# Patient Record
Sex: Male | Born: 1950 | Race: Black or African American | Hispanic: No | Smoking: Former smoker
Health system: Southern US, Community
[De-identification: ages and names within clinical notes are randomized; demographics above are authoritative.]

## PROBLEM LIST (undated history)

## (undated) DIAGNOSIS — G4730 Sleep apnea, unspecified: Secondary | ICD-10-CM

## (undated) DIAGNOSIS — I4891 Unspecified atrial fibrillation: Secondary | ICD-10-CM

## (undated) DIAGNOSIS — I10 Essential (primary) hypertension: Secondary | ICD-10-CM

## (undated) DIAGNOSIS — E7800 Pure hypercholesterolemia, unspecified: Secondary | ICD-10-CM

## (undated) HISTORY — DX: Essential (primary) hypertension: I10

## (undated) HISTORY — DX: Sleep apnea, unspecified: G47.30

## (undated) HISTORY — DX: Pure hypercholesterolemia, unspecified: E78.00

## (undated) HISTORY — DX: Unspecified atrial fibrillation: I48.91

---

## 1998-02-07 ENCOUNTER — Ambulatory Visit (HOSPITAL_COMMUNITY): Admission: RE | Admit: 1998-02-07 | Discharge: 1998-02-07 | Payer: Self-pay | Admitting: Family Medicine

## 2009-12-02 ENCOUNTER — Ambulatory Visit: Payer: Self-pay | Admitting: Family Medicine

## 2009-12-30 ENCOUNTER — Ambulatory Visit: Payer: Self-pay | Admitting: Unknown Physician Specialty

## 2010-01-02 ENCOUNTER — Ambulatory Visit: Payer: Self-pay | Admitting: Unknown Physician Specialty

## 2010-07-23 ENCOUNTER — Ambulatory Visit (HOSPITAL_BASED_OUTPATIENT_CLINIC_OR_DEPARTMENT_OTHER): Admission: RE | Admit: 2010-07-23 | Discharge: 2010-07-23 | Payer: Self-pay | Admitting: Orthopedic Surgery

## 2010-08-22 ENCOUNTER — Ambulatory Visit
Admission: RE | Admit: 2010-08-22 | Discharge: 2010-08-22 | Payer: Self-pay | Source: Home / Self Care | Attending: Orthopedic Surgery | Admitting: Orthopedic Surgery

## 2010-10-27 NOTE — Op Note (Signed)
[name], Drew Maynard              ACCOUNT NO.:  [id number]  MEDICAL RECORD NO.:  [id number]          PATIENT TYPE:  AMB  LOCATION:  DSC                          FACILITY:  [facility]  PHYSICIAN:  Cindee Salt, M.D.       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  OPERATION:  Decompression of median and ulnar nerves, left wrist.  SURGEON:  Cindee Salt, MD  ASSISTANT:  Joaquin Courts, RN  ANESTHESIA:  General.  ANESTHESIOLOGIST:  Janetta Hora. 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  EMG nerve conductions are positive.  He has elected to undergo surgical decompression.  He has undergone decompression to the right.  He is admitted now for decompression to the left.  Pre, peri, and postoperative course have been discussed along with risks and complications.  He is aware that there is no guarantee with surgery, possibility of infection, recurrence, injury to arteries, nerves, tendons, incomplete relief of symptoms, or dystrophy.  In the preoperative area, the patient is seen, the extremity marked by both the patient and surgeon, and antibiotic given.  PROCEDURE IN DETAIL:  The patient was brought to the operating room where a general anesthetic was carried out without difficulty.  He was prepped using DuraPrep, supine position, left arm free.  A 3-minute dry time was allowed.  Time-out taken confirming the patient and procedure. After adequate anesthesia was afforded, the limb was exsanguinated with an Esmarch bandage.  Tourniquet placed high on the arm was inflated to 250 mmHg.  A  curvilinear incision was made in the palm and carried down through subcutaneous tissue.  Bleeders were electrocauterized.  Palmar fascia was split.  Superficial palmar arch identified.  Flexor tendon to the ring little finger identified.  To the ulnar side of median nerve, the carpal retinaculum was incised with sharp dissection.  A right-angle and Sewall retractor were placed between skin and forearm fascia. Fascia was released for approximately a centimeter-and-half proximal to the wrist crease under direct vision.  The ulnar nerve was then identified distally.  Guyon canal was opened.  The deep motor branch was then identified with blunt sharp dissection.  This was dissected free, followed along its entire course until hooked around the hook of the hamate.  No further lesions were identified.  No ganglion cysts were noted.  The wound was then copiously irrigated with saline.  The skin closed with interrupted 5-0 Vicryl Rapide sutures.  A sterile compressive dressing and splint was applied after infiltration the area with 0.25% Marcaine, approximately 7 mL was used.  On deflation of the tourniquet, all fingers immediately pinked.  The patient tolerated the procedure well and was taken to recovery room for observation in satisfactory condition.  He will be discharged to home  to the return of the [facility] in 1 week on Vicodin.          ______________________________ Cindee Salt, M.D.     GK/MEDQ  D:  08/22/2010  T:  08/23/2010  Job:  [id number]  Electronically Signed by Cindee Salt M.D. on 10/27/2010 12:24:37 PM

## 2010-10-27 NOTE — Op Note (Signed)
Drew Maynard, RUETH              ACCOUNT NO.:  [id number]  MEDICAL RECORD NO.:  [id number]          PATIENT TYPE:  AMB  LOCATION:  DSC                          FACILITY:  [facility]  PHYSICIAN:  Cindee Salt, M.D.       DATE OF BIRTH:  [birth year]  DATE OF PROCEDURE:  07/23/2010 DATE OF DISCHARGE:                              OPERATIVE REPORT   PREOPERATIVE DIAGNOSIS:  Carpal tunnel syndrome, compression ulnar nerve at the wrist with cyst Guyon canal, right hand.  POSTOPERATIVE DIAGNOSIS:  Carpal tunnel syndrome, compression ulnar nerve at the wrist with cyst Guyon canal, right hand.  OPERATION:  Release of median nerve, release of ulnar nerve with exploration of Guyon canal, excision cyst from the lumbrical middle ring finger.  SURGEON:  Cindee Salt, MD  ASSISTANT:  Carolyne Fiscal, RN  ANESTHESIA:  General with local infiltration.  ANESTHESIOLOGIST:  Janetta Hora. Gelene Mink, MD  HISTORY:  The patient is a 60 year old male with a history of a numbness and tingling of his hand.  Nerve conductions reveal changes in both median and ulnar nerves at his wrist.  MRI reveals a cyst in the Guyon canal area of his wrist.  He has elected to undergo surgical exploration, excision, release of both median and ulnar nerve.  Pre, peri, postoperative course have been discussed along with risks and complications.  He is aware that there is no guarantee with the surgery, possibility of infection, recurrence of injury to arteries, nerves, tendons, incomplete relief of symptoms, dystrophy.  In the preoperative area, the patient is seen, the extremity marked by both the patient and surgeon, antibiotic given.  PROCEDURE:  The patient was brought to the operating room where a general anesthetic was carried out without difficulty.  He was prepped using ChloraPrep, supine position, right arm free.  A 3-minute dry time was allowed.  Time-out taken confirming the patient procedure.  After adequate anesthesia was  afforded, the limb was exsanguinated with an Esmarch bandage, tourniquet placed high on the arm was inflated to 250 mmHg.  A straight incision was made in the palm, carried down through subcutaneous tissue.  This was taken to the ulnar border obliquely at the distal wrist crease.  Bleeders were electrocauterized with bipolar. The flexor retinaculum was identified.  This was released from the ulnar aspect after isolation of the flexor tendon to the ring little finger and release of the distal structures.  The ulnar artery and nerve became very prominent in the wound.  The release was performed.  A right-angle and Sewall retractor were placed between skin and forearm fascia.  The fascia was released for approximately 2 cm proximal to the wrist crease under direct vision.  The ulnar nerve was then identified proximally and distally.  This was traced through Guyon canal.  The deep motor branch was also identified.  This was traced through the hypothenar musculature which was released in the hamate hook.  Distally, a mass was immediately apparent in the lumbrical of the middle and ring finger.  With blunt and sharp dissection, this was dissected free, sent to Pathology.  No further lesions were identified.  The wound  was copiously irrigated with saline.  The little finger flexor tendon had a tendency to ride up along the hamate hook and the flexor retinaculum was sutured with 4-0 Vicryl back to its bed on the hamate hook.  This was done by lengthening it slightly by using the most ulnar tissue and bringing it radially.  The wrist was maintained in extended position to maintain within the flexor canal.  The subcutaneous tissue was closed with interrupted 4-0 Vicryl and skin with interrupted 4-0 Vicryl Rapide sutures.  Local infiltration was given with 0.25% Marcaine without epinephrine, approximately 6 mL was used.  A sterile compressive dressing and splint with the wrist dorsiflexed was  applied.  On deflation of the tourniquet, all fingers immediately pinked.  He was taken to the recovery room for observation in satisfactory condition.  He will be discharged home to return to the [facility] in 1 week on Vicodin.          ______________________________ Cindee Salt, M.D.     GK/MEDQ  D:  07/23/2010  T:  07/24/2010  Job:  [id number]  Electronically Signed by Cindee Salt M.D. on 10/27/2010 12:24:17 PM

## 2010-11-17 LAB — POCT I-STAT, CHEM 8
BUN: 20 mg/dL (ref 6–23)
Calcium, Ion: 1.08 mmol/L — ABNORMAL LOW (ref 1.12–1.32)
Creatinine, Ser: 1.3 mg/dL (ref 0.4–1.5)
Hemoglobin: 15.3 g/dL (ref 13.0–17.0)
Sodium: 142 mEq/L (ref 135–145)
TCO2: 28 mmol/L (ref 0–100)

## 2010-11-18 LAB — BASIC METABOLIC PANEL
CO2: 27 mEq/L (ref 19–32)
Calcium: 9.3 mg/dL (ref 8.4–10.5)
Creatinine, Ser: 1.05 mg/dL (ref 0.4–1.5)
GFR calc Af Amer: >60 mL/min (ref >60)
GFR calc non Af Amer: >60 mL/min (ref >60)
Glucose, Bld: 86 mg/dL (ref 70–99)
Sodium: 136 mEq/L (ref 135–145)

## 2016-04-10 ENCOUNTER — Ambulatory Visit (INDEPENDENT_AMBULATORY_CARE_PROVIDER_SITE_OTHER): Payer: Medicare Other | Admitting: Podiatry

## 2016-04-10 ENCOUNTER — Encounter: Payer: Self-pay | Admitting: Podiatry

## 2016-04-10 VITALS — BP 118/60 | HR 44 | Resp 16 | Ht 71.0 in | Wt 240.0 lb

## 2016-04-10 DIAGNOSIS — M779 Enthesopathy, unspecified: Secondary | ICD-10-CM | POA: Diagnosis not present

## 2016-04-10 DIAGNOSIS — Q828 Other specified congenital malformations of skin: Secondary | ICD-10-CM

## 2016-04-10 MED ORDER — TRIAMCINOLONE ACETONIDE 10 MG/ML IJ SUSP
10.0000 mg | Freq: Once | INTRAMUSCULAR | Status: AC
  Administered 2016-04-10: 10 mg

## 2016-04-10 NOTE — Progress Notes (Signed)
   Subjective:    Patient ID: Drew Maynard, male    DOB: September 26, 1950, 65 y.o.   MRN: [id number]  HPI  Chief Complaint  Patient presents with  . Callouses    Right foot; lateral side-below 5th toe     Review of Systems  All other systems reviewed and are negative.      Objective:   Physical Exam        Assessment & Plan:

## 2016-04-10 NOTE — Progress Notes (Signed)
Subjective:     Patient ID: Drew Maynard, male   DOB: Mar 21, 1951, 65 y.o.   MRN: [id number]  HPI patient presents stating my right foot has been really sore on the side making it hard for me to wear shoes. I've tried wider shoes and I've tried other modalities without relief of symptoms   Review of Systems  All other systems reviewed and are negative.      Objective:   Physical Exam  Constitutional: He is oriented to person, place, and time.  Cardiovascular: Intact distal pulses.   Neurological: He is oriented to person, place, and time.  Skin: Skin is warm.  Nursing note and vitals reviewed.  neurovascular status intact muscle strength was adequate range of motion within normal limits with patient found to have a painful keratotic lesion lateral side fifth metatarsal right with fluid buildup noted and inflammation associated with it. I noted no drainage or proximal edema erythema or drainage noted. Patient's found have good digital perfusion and is well oriented 3     Assessment:     Inflammatory changes fifth MPJ right with keratotic tissue formation    Plan:     H&P condition reviewed with patient. At this point I went ahead and I recommended capsular injection and debridement to try to reduce symptoms with possibility for surgical intervention in future. I discussed all these different modalities and today I did a capsular injection 3 mg dexamethasone Kenalog 5 mg Xylocaine and I then debrided the lesion. I advised on wider shoes and reappoint to recheck

## 2017-11-11 ENCOUNTER — Other Ambulatory Visit: Payer: Self-pay | Admitting: Internal Medicine

## 2017-11-11 DIAGNOSIS — R319 Hematuria, unspecified: Secondary | ICD-10-CM

## 2017-11-16 ENCOUNTER — Ambulatory Visit: Admission: RE | Admit: 2017-11-16 | Payer: Medicare Other | Source: Ambulatory Visit

## 2017-11-16 ENCOUNTER — Other Ambulatory Visit
Admission: RE | Admit: 2017-11-16 | Discharge: 2017-11-16 | Disposition: A | Discharge status: Home / Self Care | Payer: Medicare Other | Source: Ambulatory Visit | Attending: Internal Medicine | Admitting: Internal Medicine

## 2017-11-16 ENCOUNTER — Other Ambulatory Visit: Payer: Self-pay | Admitting: Internal Medicine

## 2017-11-16 ENCOUNTER — Ambulatory Visit
Admission: RE | Admit: 2017-11-16 | Discharge: 2017-11-16 | Disposition: A | Discharge status: Home / Self Care | Payer: Medicare Other | Source: Ambulatory Visit | Attending: Internal Medicine | Admitting: Internal Medicine

## 2017-11-16 DIAGNOSIS — D3502 Benign neoplasm of left adrenal gland: Secondary | ICD-10-CM | POA: Diagnosis not present

## 2017-11-16 DIAGNOSIS — R93421 Abnormal radiologic findings on diagnostic imaging of right kidney: Secondary | ICD-10-CM | POA: Diagnosis not present

## 2017-11-16 DIAGNOSIS — D7389 Other diseases of spleen: Secondary | ICD-10-CM | POA: Insufficient documentation

## 2017-11-16 DIAGNOSIS — K7689 Other specified diseases of liver: Secondary | ICD-10-CM | POA: Diagnosis not present

## 2017-11-16 DIAGNOSIS — I700 Atherosclerosis of aorta: Secondary | ICD-10-CM | POA: Insufficient documentation

## 2017-11-16 DIAGNOSIS — R319 Hematuria, unspecified: Secondary | ICD-10-CM

## 2017-11-16 DIAGNOSIS — R911 Solitary pulmonary nodule: Secondary | ICD-10-CM | POA: Insufficient documentation

## 2017-11-16 DIAGNOSIS — R93422 Abnormal radiologic findings on diagnostic imaging of left kidney: Secondary | ICD-10-CM | POA: Insufficient documentation

## 2017-11-16 LAB — COMPREHENSIVE METABOLIC PANEL
ALBUMIN: 2.3 g/dL — AB (ref 3.5–5.0)
ALK PHOS: 65 U/L (ref 38–126)
ALT: 68 U/L — ABNORMAL HIGH (ref 17–63)
AST: 43 U/L — ABNORMAL HIGH (ref 15–41)
Anion gap: 10 (ref 5–15)
BILIRUBIN TOTAL: 1 mg/dL (ref 0.3–1.2)
BUN: 69 mg/dL — AB (ref 6–20)
CALCIUM: 8.4 mg/dL — AB (ref 8.9–10.3)
CO2: 27 mmol/L (ref 22–32)
Chloride: 98 mmol/L — ABNORMAL LOW (ref 101–111)
Creatinine, Ser: 2.46 mg/dL — ABNORMAL HIGH (ref 0.61–1.24)
GFR calc Af Amer: 30 mL/min — ABNORMAL LOW (ref >60)
GFR, EST NON AFRICAN AMERICAN: 26 mL/min — AB (ref >60)
GLUCOSE: 111 mg/dL — AB (ref 65–99)
POTASSIUM: 3.8 mmol/L (ref 3.5–5.1)
Sodium: 135 mmol/L (ref 135–145)
TOTAL PROTEIN: 7.3 g/dL (ref 6.5–8.1)

## 2017-11-16 NOTE — Progress Notes (Signed)
11/17/2017 10:45 AM   Drew Maynard 03/20/1951 [id number]  Referring provider: Katheren Shams [address] [place], [place] [address]  Chief Complaint  Patient presents with  . Hematuria    HPI: Patient is a 68 year old African American male who presents today as a referral from Dr. Ginette Pitman for gross hematuria.    He states that 5 to 6 weeks ago, he was suffering from an URI and was given a Z-pak.  After that , he developed gross hematuria, urge incontinence and suprapubic pain after urination.   He was placed on Ceftin for an UTI with E.coli and his symptoms abated.    Patient was found to have microscopic hematuria in May 2017 with 4-10 RBCs, March 2018 was 0-3 RBCs, November 2018 was 0-3 RBCs and in March 2019 with 10-50 RBCs the March 2019 incident of microscopic hematuria was associated with a positive urine culture.  The other episodes of microhematuria did not have urine cultures associated with them.      He does not have a prior history of recurrent urinary tract infections, nephrolithiasis, trauma to the genitourinary tract, BPH or malignancies of the genitourinary tract.   He does not have a family medical history of nephrolithiasis, malignancies of the genitourinary tract or hematuria.   Today, he is complaining of nocturia x 2-3 (which is baseline).  His UA today demonstrates 11-30 WBC's, 3-10 RBC's and a few bacteria.  Patient denies any gross hematuria, dysuria or suprapubic/flank pain.  Patient denies any fevers, chills, nausea or vomiting.   CT Renal stone study performed on 11/16/2017 noted a 1.9 by 2.7 cm left adrenal mass, internal density 1 Hounsfield unit, indicating adrenal adenoma. A more caudad located 2.6 by 2.1 cm left adrenal mass has an internal density of 8 Hounsfield units, likewise compatible with adenoma.  There is notable scarring in the left kidney upper pole and left mid kidney. Bilateral symmetric perirenal stranding and indistinctness  of the renal collecting systems noted, particularly on the right.  Nondistended urinary bladder.  He is a former smoker, with a 1 ppd history.  Quit 30 years ago.  He is not exposed to secondhand smoke.  He has not worked with Sports administrator, trichloroethylene, etc.    He has a high BMI.     PMH: Past Medical History:  Diagnosis Date  . Atrial fibrillation ([place])   . High cholesterol   . Hypertension   . Sleep apnea     Surgical History: No past surgical history on file.  Home Medications:  Allergies as of 11/17/2017   No Known Allergies     Medication List        Accurate as of 11/17/17 11:59 PM. Always use your most recent med list.          amiodarone 200 MG tablet Commonly known as:  PACERONE Take by mouth.   Fish Oil 1000 MG Caps Take by mouth.   furosemide 20 MG tablet Commonly known as:  LASIX Take 60 mg by mouth.   lisinopril 5 MG tablet Commonly known as:  PRINIVIL,ZESTRIL TAKE 1 TABLET BY MOUTH ONCE DAILY   MAGNESIUM PO Take by mouth.   metoprolol 200 MG 24 hr tablet Commonly known as:  TOPROL-XL   multivitamin capsule Take by mouth.   RA VITAMIN B-12 TR 1000 MCG Tbcr Generic drug:  Cyanocobalamin Take by mouth.   rivaroxaban 20 MG Tabs tablet Commonly known as:  XARELTO Take by mouth.  Allergies: No Known Allergies  Family History: No family history on file.  Social History:  reports that he has quit smoking. He has never used smokeless tobacco. He reports that he drinks alcohol. He reports that he does not use drugs.  ROS: UROLOGY Frequent Urination?: No Hard to postpone urination?: No Burning/pain with urination?: No Get up at night to urinate?: Yes Leakage of urine?: No Urine stream starts and stops?: No Trouble starting stream?: No Do you have to strain to urinate?: No Blood in urine?: Yes Urinary tract infection?: Yes Sexually transmitted disease?: No Injury to kidneys or bladder?: No Painful intercourse?:  No Weak stream?: No Erection problems?: No Penile pain?: No  Gastrointestinal Nausea?: No Vomiting?: No Indigestion/heartburn?: No Diarrhea?: No Constipation?: No  Constitutional Fever: No Night sweats?: No Weight loss?: No Fatigue?: No  Skin Skin rash/lesions?: No Itching?: No  Eyes Blurred vision?: No Double vision?: No  Ears/Nose/Throat Sore throat?: No Sinus problems?: No  Hematologic/Lymphatic Swollen glands?: No Easy bruising?: No  Cardiovascular Leg swelling?: No Chest pain?: No  Respiratory Cough?: Yes Shortness of breath?: No  Endocrine Excessive thirst?: No  Musculoskeletal Back pain?: No Joint pain?: No  Neurological Headaches?: No Dizziness?: No  Psychologic Depression?: No Anxiety?: No  Physical Exam: BP 126/68   Pulse 78   Ht 5\' 10"  (1.778 m)   Wt 268 lb (121.6 kg)   BMI 38.45 kg/m   Constitutional: Well nourished. Alert and oriented, No acute distress. HEENT: [place] AT, moist mucus membranes. Trachea midline, no masses. Cardiovascular: No clubbing, cyanosis, or edema. Respiratory: Normal respiratory effort, no increased work of breathing. GI: Abdomen is soft, non tender, non distended, no abdominal masses. Liver and spleen not palpable.  No hernias appreciated.  Stool sample for occult testing is not indicated.   GU: No CVA tenderness.  No bladder fullness or masses.  Patient with circumcised phallus.   Urethral meatus is patent.  No penile discharge. No penile lesions or rashes. Scrotum without lesions, cysts, rashes and/or edema.  Testicles are located scrotally bilaterally. No masses are appreciated in the testicles. Left and right epididymis are normal. Rectal: Patient with  normal sphincter tone. Anus and perineum without scarring or rashes. No rectal masses are appreciated. Prostate is approximately 55 grams, no nodules are appreciated. Seminal vesicles are normal. Skin: No rashes, bruises or suspicious lesions. Lymph: No  cervical or inguinal adenopathy. Neurologic: Grossly intact, no focal deficits, moving all 4 extremities. Psychiatric: Normal mood and affect.  Laboratory Data: PSA history 1.21 in February 2016 0.99 in August 2016 1.17 in March 2018 Lab Results  Component Value Date   HGB 15.3 08/22/2010   HCT 45.0 08/22/2010    Lab Results  Component Value Date   CREATININE 2.46 (H) 11/16/2017    No results found for: PSA  No results found for: TESTOSTERONE  No results found for: HGBA1C  No results found for: TSH  No results found for: CHOL, HDL, CHOLHDL, VLDL, LDLCALC  Lab Results  Component Value Date   AST 43 (H) 11/16/2017   Lab Results  Component Value Date   ALT 68 (H) 11/16/2017   No components found for: ALKALINEPHOPHATASE No components found for: BILIRUBINTOTAL  No results found for: ESTRADIOL   Urinalysis 11-30 WBC's.  3-10 RBC's.  Few bacteria.  See Epic. I have reviewed the labs.  Pertinent Imaging: CLINICAL DATA:  Painless hematuria. Recent viral infection. Prior hernia repair and appendectomy.  EXAM: CT ABDOMEN AND PELVIS WITHOUT CONTRAST  TECHNIQUE: Multidetector  CT imaging of the abdomen and pelvis was performed following the standard protocol without IV contrast. IV contrast was not administered due to renal insufficiency.  COMPARISON:  None.  FINDINGS: Lower chest: 5 by 4 mm left lower lobe pulmonary nodule on image 23/3.  Mild cardiomegaly.  Hepatobiliary: Multiple small hypodense lesions are present in the liver and are technically nonspecific. A larger left hepatic lobe lesion measuring 2.7 by 2.3 cm has an internal density 0 Hounsfield units, and is most likely a cyst. There is some dependent density in the gallbladder which is probably from sludge.  Pancreas: Unremarkable  Spleen: Anteromedial splenic convexity due to a 4.8 by 3.6 cm hypodense splenic lesion which is reasonably homogeneous and which has a density of 4.7  Hounsfield units. This is measured on image 26/2.  Adrenals/Urinary Tract: 1.9 by 2.7 cm left adrenal mass, internal density 1 Hounsfield unit, indicating adrenal adenoma. A more caudad located 2.6 by 2.1 cm left adrenal mass has an internal density of 8 Hounsfield units, likewise compatible with adenoma.  There is notable scarring in the left kidney upper pole and left mid kidney. Bilateral symmetric perirenal stranding and indistinctness of the renal collecting systems noted, particularly on the right. Nondistended urinary bladder.  Stomach/Bowel: There are a few scattered colonic diverticula.  Vascular/Lymphatic: Aortoiliac atherosclerotic vascular disease. No pathologic adenopathy.  Reproductive: Unremarkable  Other: No supplemental non-categorized findings.  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  IMPRESSION: 1. Bilateral perirenal stranding with indistinctness of the collecting systems, especially on the right. Inflammatory process such as pyelonephritis is not excluded and urine analysis would be suggested. No discrete urinary tract calculi are identified. 2. Scarring in the left kidney upper pole and left mid kidney. The largest of the hepatic lesions has fluid density and is probably a cyst. The smaller lesions are technically nonspecific. 3. Partially exophytic hypodense lesion of the spleen is nonspecific, although near fluid density. 4. There are 2 left adrenal adenomas. 5.  Aortic Atherosclerosis (ICD10-I70.0). 6. 5 by 4 mm left lower lobe pulmonary nodule. No follow-up needed if patient is low-risk. Non-contrast chest CT can be considered in 12 months if patient is high-risk. This recommendation follows the consensus statement: Guidelines for Management of Incidental Pulmonary Nodules Detected on CT Images: From the Fleischner Society 2017; Radiology 2017; 284:228-243.   Electronically Signed   By: Van Clines M.D.   On: 11/16/2017 15:06 I have independently reviewed the films  Assessment & Plan:   1. Gross hematuria  - I explained to the patient that there are a number of causes that can be associated with blood in the urine, such as stones, BPH, UTI's, damage to the urinary tract and/or cancer - his UA is suspicious for infection so we will send it for culture   - Explained to the patient that the imaging studies performed on 11/16/2017 are not the recommended studies by the AUA for the work-up of blood in the urine.    The imaging studies that were performed lack the detail of excluding some urological tumors.  The recommended study is a CT urogram.  This study does require the use of contrast material.    At this time, he/she may choose to forego the appropriate study with the understanding that they are risking a missed diagnosis of a urological cancer, or choose to undergo the appropriate study (CTU) or choose to undergo a cystoscopy with bilateral RTG's to complete the hematuria work up - will make  this decision once the urine culture is final, if there is infection will need to treat and recheck the urine  - I explained to the patient that a contrast material will be injected into a vein and that in rare instances, an allergic reaction can result and may even life threatening   The patient denies any allergies to contrast, iodine and/or seafood and is not taking metformin  - Following the imaging study,  I've recommended a cystoscopy. I described how this is performed, typically in an office setting with a flexible cystoscope. We described the risks, benefits, and possible side effects, the most common of which is a minor amount of blood in the urine and/or burning which usually resolves in 24 to 48 hours.    - UA  - Urine culture  - BUN + creatinine     Return for pending urine culture.  These notes generated with voice recognition software. I apologize for typographical  errors.  Zara Council, [facility] [address] [place], [place] [address] [phone]

## 2017-11-17 ENCOUNTER — Ambulatory Visit (INDEPENDENT_AMBULATORY_CARE_PROVIDER_SITE_OTHER): Payer: Medicare Other | Admitting: Urology

## 2017-11-17 ENCOUNTER — Encounter: Payer: Self-pay | Admitting: Urology

## 2017-11-17 VITALS — BP 126/68 | HR 78 | Ht 70.0 in | Wt 268.0 lb

## 2017-11-17 DIAGNOSIS — R310 Gross hematuria: Secondary | ICD-10-CM

## 2017-11-17 LAB — URINALYSIS, COMPLETE
BILIRUBIN UA: NEGATIVE
GLUCOSE, UA: NEGATIVE
Ketones, UA: NEGATIVE
NITRITE UA: NEGATIVE
PROTEIN UA: NEGATIVE
Specific Gravity, UA: <1.005 — ABNORMAL LOW (ref 1.005–1.030)
UUROB: 0.2 mg/dL (ref 0.2–1.0)
pH, UA: 5.5 (ref 5.0–7.5)

## 2017-11-17 LAB — MICROSCOPIC EXAMINATION

## 2017-11-17 LAB — BLADDER SCAN AMB NON-IMAGING: SCAN RESULT: 25

## 2017-11-19 LAB — CULTURE, URINE COMPREHENSIVE

## 2017-11-22 ENCOUNTER — Telehealth: Payer: Self-pay

## 2017-11-22 NOTE — Telephone Encounter (Signed)
Pt wants to know if the CT/cysto is medically necessary. Please give pt a call.

## 2017-11-22 NOTE — Telephone Encounter (Signed)
Spoke with pt in reference to -ucx and needing a CT/cysto. Pt stated that he had a CT performed last week. Will that CT be sufficient? Cysto appt was scheduled. Please advise.

## 2017-11-22 NOTE — Telephone Encounter (Signed)
No.  It was a non contrast CT.  It may miss some urological tumors due to not having contrast.  He needs to be scheduled for a CTU.

## 2017-11-22 NOTE — Telephone Encounter (Signed)
-----   Message from Nori Riis, PA-C sent at 11/21/2017  5:27 PM EDT ----- Please let Mr Drew Maynard know that his urine culture was negative.  He will need to have a CT urogram and a cystoscopic schedule at this time for workup of his microscopic hematuria.

## 2017-11-25 NOTE — Telephone Encounter (Signed)
LMOM

## 2017-11-29 NOTE — Telephone Encounter (Signed)
Spoke with pt in reference to CT and cysto. All questions answered. Pt voiced understanding.

## 2017-12-01 ENCOUNTER — Other Ambulatory Visit: Payer: Self-pay | Admitting: Internal Medicine

## 2017-12-01 DIAGNOSIS — N5089 Other specified disorders of the male genital organs: Secondary | ICD-10-CM

## 2017-12-06 ENCOUNTER — Ambulatory Visit
Admission: RE | Admit: 2017-12-06 | Discharge: 2017-12-06 | Disposition: A | Discharge status: Home / Self Care | Payer: Medicare Other | Source: Ambulatory Visit | Attending: Internal Medicine | Admitting: Internal Medicine

## 2017-12-06 DIAGNOSIS — N5089 Other specified disorders of the male genital organs: Secondary | ICD-10-CM | POA: Diagnosis present

## 2017-12-07 ENCOUNTER — Encounter: Payer: Self-pay | Admitting: Urology

## 2017-12-07 ENCOUNTER — Ambulatory Visit (INDEPENDENT_AMBULATORY_CARE_PROVIDER_SITE_OTHER): Payer: Medicare Other | Admitting: Urology

## 2017-12-07 VITALS — BP 109/75 | HR 77 | Resp 16 | Ht 70.0 in | Wt 255.0 lb

## 2017-12-07 DIAGNOSIS — R310 Gross hematuria: Secondary | ICD-10-CM

## 2017-12-07 NOTE — Progress Notes (Signed)
67 year old male scheduled for cystoscopy today for gross hematuria.  Urinalysis showed 11-30 WBCs.  Urine culture was ordered.  Cystoscopy was postponed and will be rescheduled.

## 2017-12-07 NOTE — Addendum Note (Signed)
Addended by: Tommy Rainwater on: 12/07/2017 04:02 PM   Modules accepted: Orders

## 2017-12-08 ENCOUNTER — Other Ambulatory Visit: Payer: Medicare Other

## 2017-12-08 LAB — URINALYSIS, COMPLETE
BILIRUBIN UA: NEGATIVE
Glucose, UA: NEGATIVE
Ketones, UA: NEGATIVE
Nitrite, UA: NEGATIVE
PH UA: 6 (ref 5.0–7.5)
Specific Gravity, UA: 1.01 (ref 1.005–1.030)
Urobilinogen, Ur: 1 mg/dL (ref 0.2–1.0)

## 2017-12-08 LAB — MICROSCOPIC EXAMINATION: EPITHELIAL CELLS (NON RENAL): NONE SEEN /HPF (ref 0–10)

## 2017-12-09 LAB — CULTURE, URINE COMPREHENSIVE

## 2017-12-10 ENCOUNTER — Telehealth: Payer: Self-pay | Admitting: Family Medicine

## 2017-12-10 NOTE — Telephone Encounter (Signed)
-----   Message from Abbie Sons, MD sent at 12/10/2017  1:00 PM EDT ----- Urine culture was negative.  Need to reschedule cystoscopy

## 2017-12-10 NOTE — Telephone Encounter (Signed)
Patient notified and will call back to reschedule his Cysto

## 2017-12-13 ENCOUNTER — Telehealth: Payer: Self-pay | Admitting: Radiology

## 2017-12-13 ENCOUNTER — Encounter: Payer: Self-pay | Admitting: Urology

## 2017-12-13 ENCOUNTER — Ambulatory Visit (INDEPENDENT_AMBULATORY_CARE_PROVIDER_SITE_OTHER): Payer: Medicare Other | Admitting: Urology

## 2017-12-13 VITALS — BP 117/79 | HR 69 | Resp 16 | Ht 70.0 in | Wt 263.6 lb

## 2017-12-13 DIAGNOSIS — R310 Gross hematuria: Secondary | ICD-10-CM | POA: Diagnosis not present

## 2017-12-13 LAB — URINALYSIS, COMPLETE
BILIRUBIN UA: NEGATIVE
Glucose, UA: NEGATIVE
Ketones, UA: NEGATIVE
NITRITE UA: NEGATIVE
PH UA: 6 (ref 5.0–7.5)
RBC, UA: NEGATIVE
Specific Gravity, UA: 1.015 (ref 1.005–1.030)
UUROB: 0.2 mg/dL (ref 0.2–1.0)

## 2017-12-13 LAB — MICROSCOPIC EXAMINATION

## 2017-12-13 MED ORDER — ALFUZOSIN HCL ER 10 MG PO TB24: 10.0000 mg | ORAL_TABLET | Freq: Every day | ORAL | 1 refills | Status: DC

## 2017-12-13 MED ORDER — LIDOCAINE HCL 2 % EX GEL
1.0000 "application " | Freq: Once | CUTANEOUS | Status: AC
  Administered 2017-12-13: 1 via URETHRAL

## 2017-12-13 MED ORDER — ALFUZOSIN HCL ER 10 MG PO TB24: 10.0000 mg | ORAL_TABLET | Freq: Every day | ORAL | 3 refills | Status: DC

## 2017-12-13 MED ORDER — CIPROFLOXACIN HCL 500 MG PO TABS
500.0000 mg | ORAL_TABLET | Freq: Once | ORAL | Status: AC
  Administered 2017-12-13: 500 mg via ORAL

## 2017-12-13 NOTE — Progress Notes (Signed)
   12/13/17  CC: No chief complaint on file.   HPI: Refer to Drew Maynard McGowan's previous note of 11/17/2017.  He denies recurrent gross hematuria.  Blood pressure 117/79, pulse 69, resp. rate 16, height 5\' 10"  (1.778 m), weight 263 lb 9.6 oz (119.6 kg), SpO2 98 %. NED. A&Ox3.   No respiratory distress   Abd soft, NT, ND Normal phallus with bilateral descended testicles  Cystoscopy Procedure Note  Patient identification was confirmed, informed consent was obtained, and patient was prepped using Betadine solution.  Lidocaine jelly was administered per urethral meatus.    Preoperative abx where received prior to procedure.     Pre-Procedure: - Inspection reveals a normal caliber ureteral meatus.  Procedure: The flexible cystoscope was introduced without difficulty - No urethral strictures/lesions are present. - Moderate lateral lobe enlargement prostate with hypervascularity and friability - Moderate bladder neck elevation - Bilateral ureteral orifices identified - Bladder mucosa  reveals no ulcers, tumors, or lesions - No bladder stones - No trabeculation  Retroflexion shows no intravesical median lobe   Post-Procedure: - Patient tolerated the procedure well  Assessment/ Plan: No significant abnormalities identified on cystoscopy.  Based on today's cystoscopy the most likely source of his hematuria is the prostate.  He does have nocturia x2 and urinary hesitance.  Will place on alfuzosin (formulary preference) and have him follow-up with Drew Maynard in 4-6 weeks.  For persistent hematuria would recommend CT urogram.

## 2017-12-13 NOTE — Telephone Encounter (Signed)
There was no drug interaction pop-up when medication was prescribed.  I think he should be fine to take this medication.

## 2017-12-13 NOTE — Telephone Encounter (Signed)
Made pt aware that Dr Bernardo Heater feels he should be able to take medication. Advised pt to call back if he begins to have symptoms. Questions answered. Pt voices understanding.

## 2017-12-13 NOTE — Telephone Encounter (Signed)
Pt states he was prescribed alfuzosin by Dr Bernardo Heater. His pharmacist told him it interacts with another medication & can cause heart flutters. Please advise.

## 2017-12-13 NOTE — Addendum Note (Signed)
Addended by: Garnette Gunner on: 12/13/2017 12:06 PM   Modules accepted: Orders

## 2017-12-17 ENCOUNTER — Other Ambulatory Visit: Payer: Self-pay

## 2017-12-17 MED ORDER — ALFUZOSIN HCL ER 10 MG PO TB24: 10.0000 mg | ORAL_TABLET | Freq: Every day | ORAL | 1 refills | Status: DC

## 2018-01-18 NOTE — Progress Notes (Signed)
01/19/2018 11:29 AM   Drew Maynard [birth year] [id number]  Referring provider: Katheren Shams [address] [place], [place] [address]  Chief Complaint  Patient presents with  . Follow-up    HPI: 67 yo AAM with gross hematuria who presents for a follow up.   Background history Patient is a 67 year old African American male who presents today as a referral from Dr. Ginette Pitman for gross hematuria.  He states that 5 to 6 weeks ago, he was suffering from an URI and was given a Z-pak.  After that , he developed gross hematuria, urge incontinence and suprapubic pain after urination.   He was placed on Ceftin for an UTI with E.coli and his symptoms abated.  Patient was found to have microscopic hematuria in May 2017 with 4-10 RBCs, March 2018 was 0-3 RBCs, November 2018 was 0-3 RBCs and in March 2019 with 10-50 RBCs the March 2019 incident of microscopic hematuria was associated with a positive urine culture.  The other episodes of microhematuria did not have urine cultures associated with them.   He does not have a prior history of recurrent urinary tract infections, nephrolithiasis, trauma to the genitourinary tract, BPH or malignancies of the genitourinary tract.   He does not have a family medical history of nephrolithiasis, malignancies of the genitourinary tract or hematuria.   Today, he is complaining of nocturia x 2-3 (which is baseline).  His UA today demonstrates 11-30 WBC's, 3-10 RBC's and a few bacteria.  Patient denies any gross hematuria, dysuria or suprapubic/flank pain.  Patient denies any fevers, chills, nausea or vomiting.   CT Renal stone study performed on 11/16/2017 noted a 1.9 by 2.7 cm left adrenal mass, internal density 1 Hounsfield unit, indicating adrenal adenoma. A more caudad located 2.6 by 2.1 cm left adrenal mass has an internal density of 8 Hounsfield units, likewise compatible with adenoma.  There is notable scarring in the left kidney upper pole and left  mid kidney. Bilateral symmetric perirenal stranding and indistinctness of the renal collecting systems noted, particularly on the right.  Nondistended urinary bladder.   He is a former smoker, with a 1 ppd history.  Quit 30 years ago.  He is not exposed to secondhand smoke.  He has not worked with Sports administrator, trichloroethylene, etc.    He has a high BMI.    Cystoscopy performed on 12/13/2017 with Dr. Bernardo Heater noted moderate lateral lobe enlargement prostate with hypervascularity and friability and moderate bladder neck elevation.  He was started on alfuzosin at this visit for his LU TS.  BPH WITH LUTS  (prostate and/or bladder) IPSS score: 10/2  PVR: 35 mL       Major complaint(s): frequency and urgency x a long time.  Denies any dysuria, hematuria or suprapubic pain.   Currently taking: alfuzosin  Denies any recent fevers, chills, nausea or vomiting.  He does not have a family history of PCa.    IPSS    Row Name 01/19/18 1100         International Prostate Symptom Score   How often have you had the sensation of not emptying your bladder?  Not at All     How often have you had to urinate less than every two hours?  About half the time     How often have you found you stopped and started again several times when you urinated?  Not at All     How often have you found it difficult to postpone  urination?  About half the time     How often have you had a weak urinary stream?  Less than half the time     How often have you had to strain to start urination?  Not at All     How many times did you typically get up at night to urinate?  2 Times     Total IPSS Score  10       Quality of Life due to urinary symptoms   If you were to spend the rest of your life with your urinary condition just the way it is now how would you feel about that?  Mostly Satisfied        Score:  1-7 Mild 8-19 Moderate 20-35 Severe    PMH: Past Medical History:  Diagnosis Date  . Atrial  fibrillation ([place])   . High cholesterol   . Hypertension   . Sleep apnea     Surgical History: History reviewed. No pertinent surgical history.  Home Medications:  Allergies as of 01/19/2018      Reactions   Spironolactone    Gynecomastia      Medication List        Accurate as of 01/19/18 11:29 AM. Always use your most recent med list.          alfuzosin 10 MG 24 hr tablet Commonly known as:  UROXATRAL Take 1 tablet (10 mg total) by mouth daily with breakfast.   amiodarone 200 MG tablet Commonly known as:  PACERONE Take 200 mg by mouth daily.   eplerenone 25 MG tablet Commonly known as:  INSPRA TAKE 0.5 TABLETS (12.5 MG TOTAL) BY MOUTH DAILY.   Fish Oil 1000 MG Caps Take by mouth daily.   furosemide 20 MG tablet Commonly known as:  LASIX Take 60 mg by mouth.   MAGNESIUM PO Take by mouth.   metoprolol 200 MG 24 hr tablet Commonly known as:  TOPROL-XL   multivitamin capsule Take 1 capsule by mouth daily.   RA VITAMIN B-12 TR 1000 MCG Tbcr Generic drug:  Cyanocobalamin Take by mouth.   rivaroxaban 20 MG Tabs tablet Commonly known as:  XARELTO Take 20 mg by mouth daily with supper.       Allergies:  Allergies  Allergen Reactions  . Spironolactone     Gynecomastia    Family History: History reviewed. No pertinent family history.  Social History:  reports that he has quit smoking. He has never used smokeless tobacco. He reports that he drinks alcohol. He reports that he does not use drugs.  ROS: UROLOGY Frequent Urination?: No Hard to postpone urination?: No Burning/pain with urination?: No Get up at night to urinate?: No Leakage of urine?: No Urine stream starts and stops?: No Trouble starting stream?: No Do you have to strain to urinate?: No Blood in urine?: No Urinary tract infection?: No Sexually transmitted disease?: No Injury to kidneys or bladder?: No Painful intercourse?: No Weak stream?: No Erection problems?: No Penile  pain?: No  Gastrointestinal Nausea?: No Vomiting?: No Indigestion/heartburn?: No Diarrhea?: No Constipation?: No  Constitutional Fever: No Night sweats?: No Weight loss?: No Fatigue?: No  Skin Skin rash/lesions?: No Itching?: No  Eyes Blurred vision?: No Double vision?: No  Ears/Nose/Throat Sore throat?: No Sinus problems?: No  Hematologic/Lymphatic Swollen glands?: No Easy bruising?: No  Cardiovascular Leg swelling?: No Chest pain?: No  Respiratory Cough?: No Shortness of breath?: No  Endocrine Excessive thirst?: No  Musculoskeletal Back pain?: No Joint pain?:  No  Neurological Headaches?: No Dizziness?: No  Psychologic Depression?: No Anxiety?: No  Physical Exam: BP (!) 92/56 (BP Location: Right Arm, Patient Position: Sitting, Cuff Size: Large)   Pulse 60   Ht 5\' 10"  (1.778 m)   Wt 262 lb 8 oz (119.1 kg)   SpO2 99%   BMI 37.66 kg/m   Constitutional: Well nourished. Alert and oriented, No acute distress. HEENT: [place] AT, moist mucus membranes. Trachea midline, no masses. Cardiovascular: No clubbing, cyanosis, or edema. Respiratory: Normal respiratory effort, no increased work of breathing. Skin: No rashes, bruises or suspicious lesions. Lymph: No cervical or inguinal adenopathy. Neurologic: Grossly intact, no focal deficits, moving all 4 extremities. Psychiatric: Normal mood and affect.  Laboratory Data: PSA history 1.21 in February 2016 0.99 in August 2016 1.17 in March 2018 Lab Results  Component Value Date   HGB 15.3 08/22/2010   HCT 45.0 08/22/2010    Lab Results  Component Value Date   CREATININE 2.46 (H) 11/16/2017    No results found for: PSA  No results found for: TESTOSTERONE  No results found for: HGBA1C  No results found for: TSH  No results found for: CHOL, HDL, CHOLHDL, VLDL, LDLCALC  Lab Results  Component Value Date   AST 43 (H) 11/16/2017   Lab Results  Component Value Date   ALT 68 (H) 11/16/2017    No components found for: ALKALINEPHOPHATASE No components found for: BILIRUBINTOTAL  No results found for: ESTRADIOL I have reviewed the labs.  Pertinent Imaging: Results for Drew Maynard, Drew Maynard (MRN [id number]) as of 01/19/2018 11:22  Ref. Range 01/19/2018 11:19  Scan Result Unknown 21mL    Assessment & Plan:   1. History of hematuria No significant abnormalities identified on cystoscopy.  Based on today's cystoscopy the most likely source of his hematuria is the prostate.   Will need to check an UA in one year Patient will report any gross hematuria  For persistent hematuria would recommend CT urogram.  2. BPH with LUTS  - IPSS score is 10/2  - Continue conservative management, avoiding bladder irritants and timed voiding's  - most bothersome symptoms is/are frequency  - Continue alfuzosin 10 mg daily as he feels it is effective  - PSA  - RTC in 12 months for IPSS, PSA, PVR and exam - if PSA is normal  3. Nocturia Sleeps with CPAP machine    Return in about 1 year (around 01/20/2019) for IPSS, PSA, UA and exam.  These notes generated with voice recognition software. I apologize for typographical errors.  Zara Council, PA-C  [facility] [address] [place], [place] [address] [phone]

## 2018-01-19 ENCOUNTER — Encounter: Payer: Self-pay | Admitting: Urology

## 2018-01-19 ENCOUNTER — Ambulatory Visit (INDEPENDENT_AMBULATORY_CARE_PROVIDER_SITE_OTHER): Payer: Medicare Other | Admitting: Urology

## 2018-01-19 VITALS — BP 92/56 | HR 60 | Ht 70.0 in | Wt 262.5 lb

## 2018-01-19 DIAGNOSIS — N138 Other obstructive and reflux uropathy: Secondary | ICD-10-CM

## 2018-01-19 DIAGNOSIS — N401 Enlarged prostate with lower urinary tract symptoms: Secondary | ICD-10-CM | POA: Diagnosis not present

## 2018-01-19 DIAGNOSIS — R351 Nocturia: Secondary | ICD-10-CM

## 2018-01-19 DIAGNOSIS — Z87448 Personal history of other diseases of urinary system: Secondary | ICD-10-CM

## 2018-01-19 LAB — BLADDER SCAN AMB NON-IMAGING

## 2018-01-19 MED ORDER — ALFUZOSIN HCL ER 10 MG PO TB24: 10.0000 mg | ORAL_TABLET | Freq: Every day | ORAL | 3 refills | Status: AC

## 2018-01-20 ENCOUNTER — Telehealth: Payer: Self-pay

## 2018-01-20 LAB — PSA: Prostate Specific Ag, Serum: 1.1 ng/mL (ref 0.0–4.0)

## 2018-01-20 NOTE — Telephone Encounter (Signed)
Patient notified on vmail 

## 2018-01-20 NOTE — Telephone Encounter (Signed)
-----   Message from Nori Riis, PA-C sent at 01/20/2018  8:47 AM EDT ----- Please let Mr. Ramsburg know that his PSA returned within the normal range at 1.1.

## 2018-03-07 DEATH — deceased

## 2019-01-16 ENCOUNTER — Other Ambulatory Visit: Payer: Medicare Other

## 2019-01-18 ENCOUNTER — Ambulatory Visit: Payer: Medicare Other | Admitting: Urology

## 2019-05-27 IMAGING — US US SCROTUM W/ DOPPLER COMPLETE
1 series · 13 of 25 positions shown · non-contrast
Comparison: None.

CLINICAL DATA: Right testicular swelling for 3 weeks.

EXAM:
SCROTAL ULTRASOUND
DOPPLER ULTRASOUND OF THE TESTICLES
TECHNIQUE: Complete ultrasound examination of the testicles, epididymis, and
other scrotal structures was performed. Color and spectral Doppler
ultrasound were also utilized to evaluate blood flow to the
testicles.

[Series 1: us scrotum w/ doppler complete · 0.06mm/px · 13 of 69 slices shown]
[im 1/69]
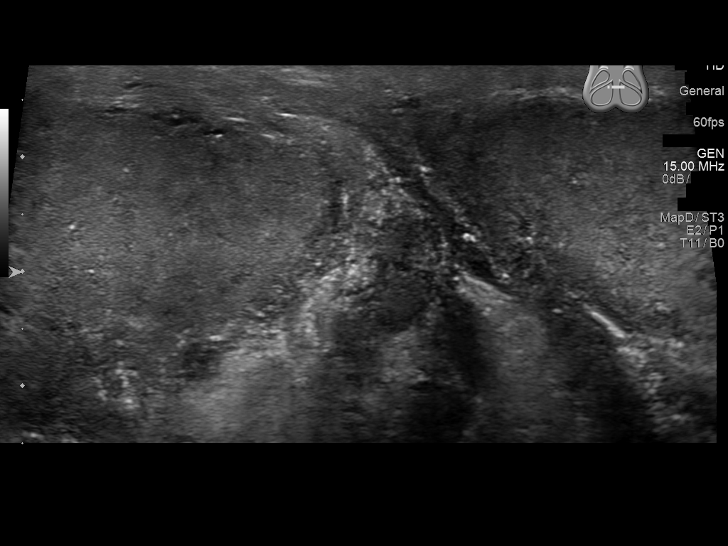
[im 6/69]
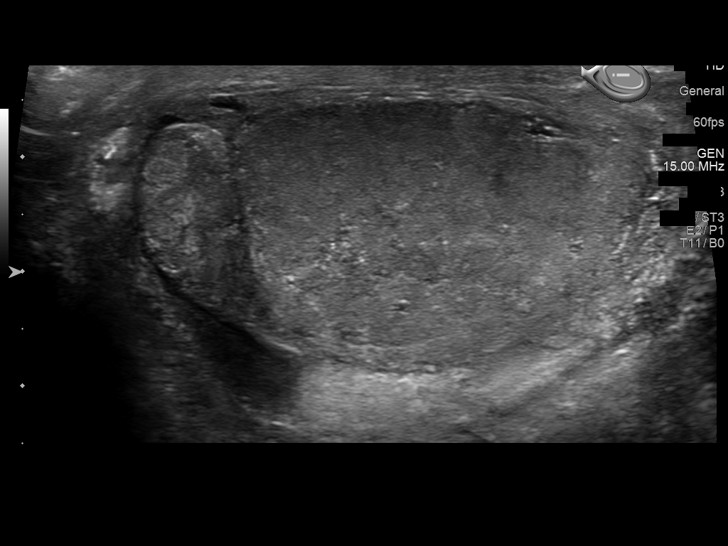
[im 12/69]
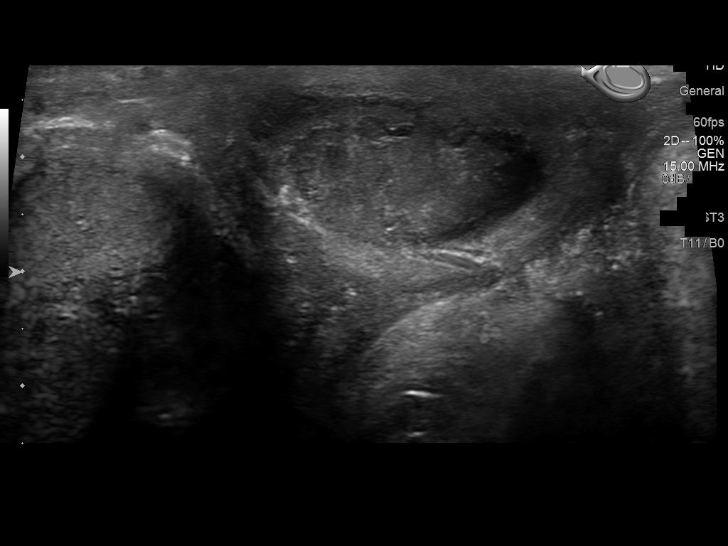
[im 18/69]
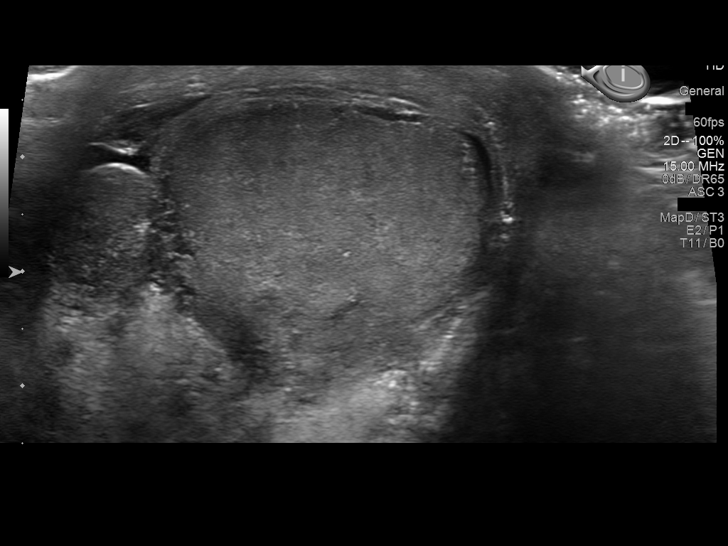
[im 23/69]
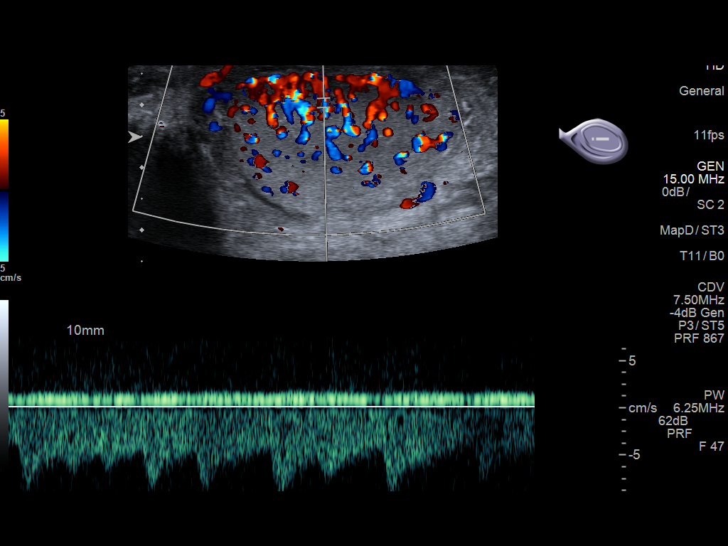
[im 29/69]
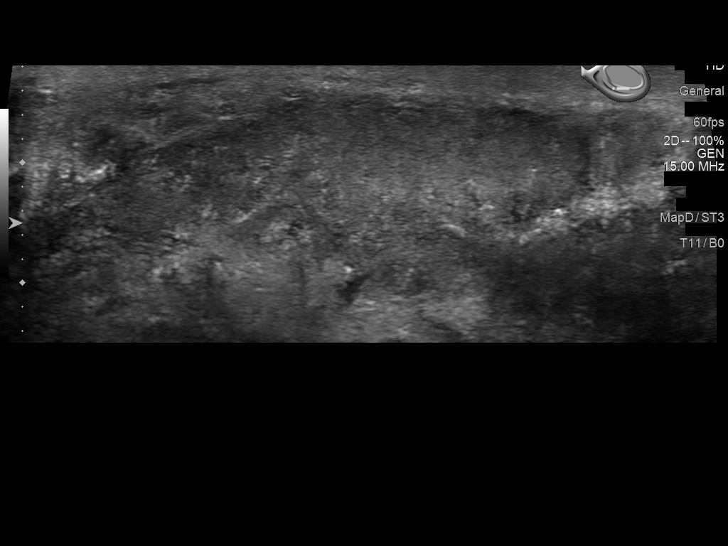
[im 35/69]
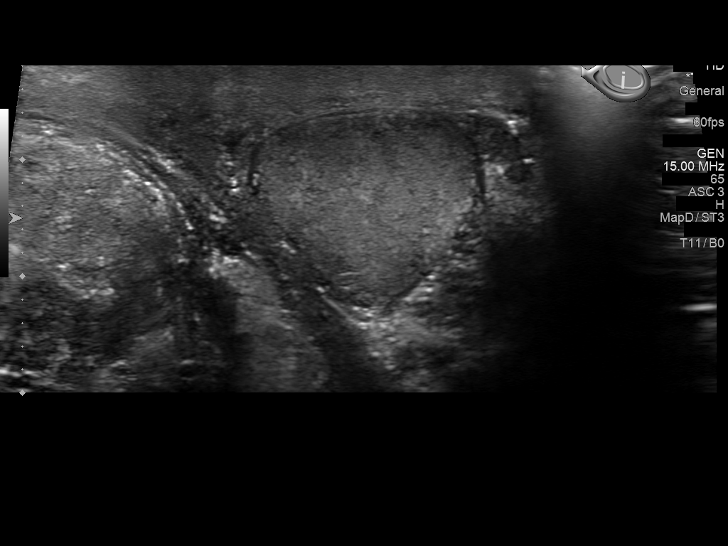
[im 40/69]
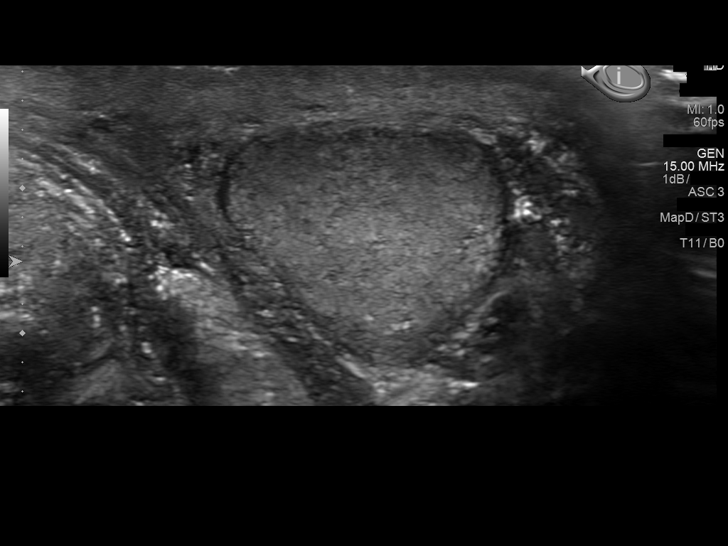
[im 46/69]
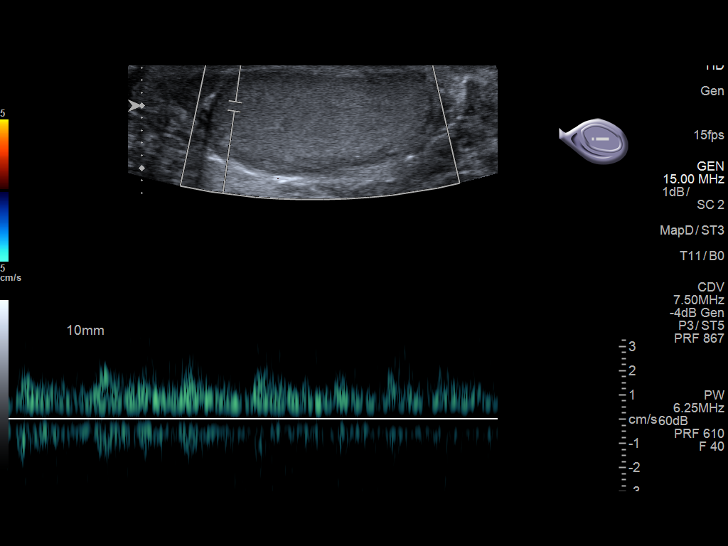
[im 52/69]
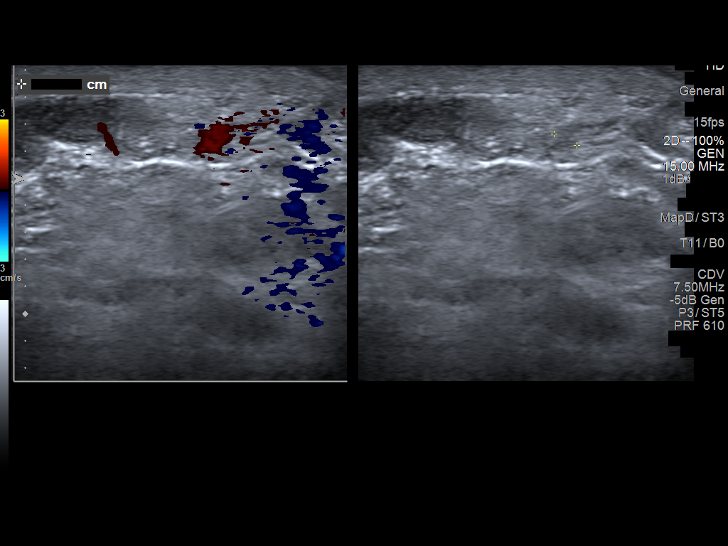
[im 57/69]
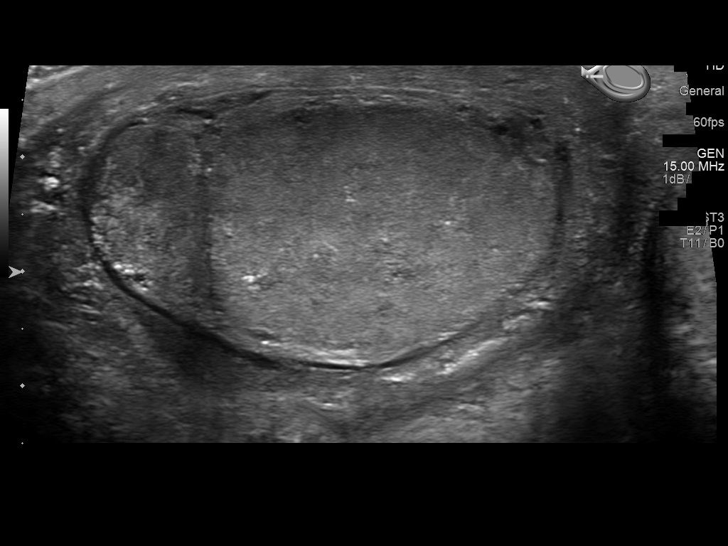
[im 63/69]
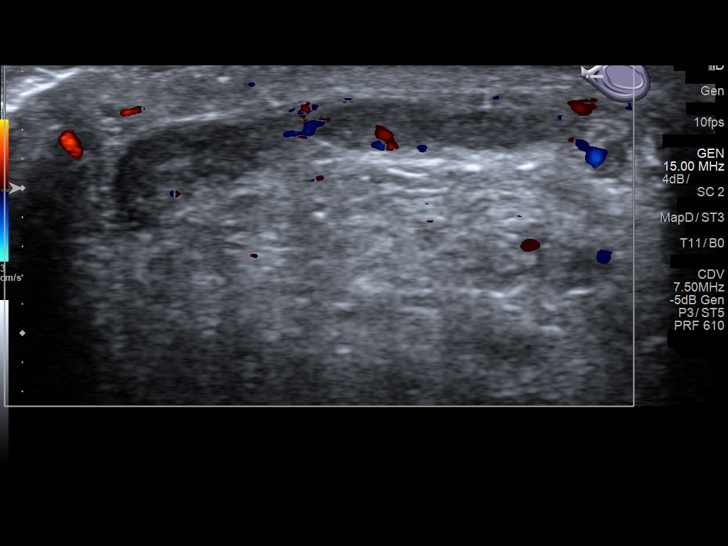
[im 69/69]
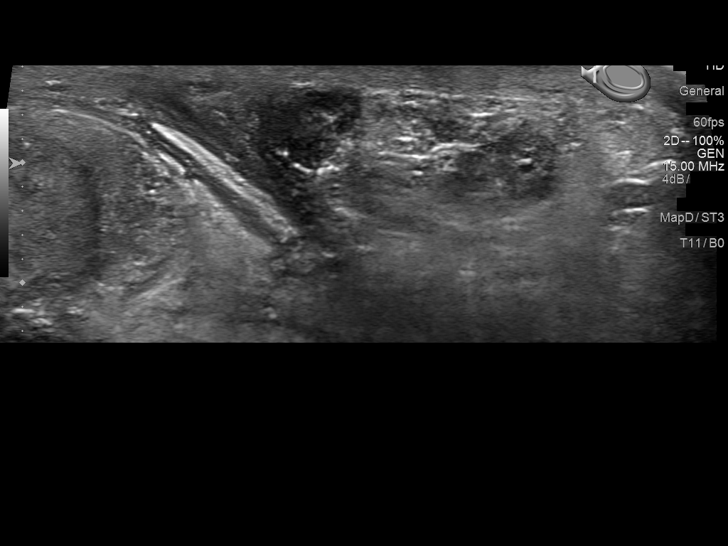

[13 of 25 positions shown; findings below may reference images not displayed]

FINDINGS: Right testicle

Measurements: 3.7 x 2.2 x 2.6 cm. Diffusely heterogeneous appearance
of the right testicle and hypervascularity. Scattered
microlithiasis.

Left testicle

Measurements: 3.8 x 1.5 x 1.9 cm. No mass. Scattered microlithiasis
visualized.

Right epididymis:  Hypervascular right epididymis.

Left epididymis:  Normal in size and appearance.

Hydrocele:  None visualized.

Varicocele:  None visualized.

Pulsed Doppler interrogation of both testes demonstrates normal low
resistance arterial and venous waveforms bilaterally.
IMPRESSION: Diffusely heterogeneous appearance of the right testicle and
hypervascularity. Differential diagnosis includes
infectious/inflammatory changes, or granulomatous disease. Diffusely
infiltrating malignancy is considered less likely however is on the
differential diagnosis. Follow-up after empiric treatment is
recommended.

Probable inflammatory changes of the right epididymis.

## 2019-06-08 IMAGING — CT CT ABD-PELV W/O CM
1 of 2 series · 14 of 32 positions shown, 18 images · non-contrast
Comparison: None.

CLINICAL DATA: Painless hematuria. Recent viral infection. Prior
hernia repair and appendectomy.

EXAM:
CT ABDOMEN AND PELVIS WITHOUT CONTRAST
TECHNIQUE: Multidetector CT imaging of the abdomen and pelvis was performed
following the standard protocol without IV contrast. IV contrast was
not administered due to renal insufficiency.

[Series 2: axial st · axial · 0.73mm/px · z∈[-481,-26]mm · 14 of 101 slices shown, 18 images]
[im 5/101  soft-tissue]
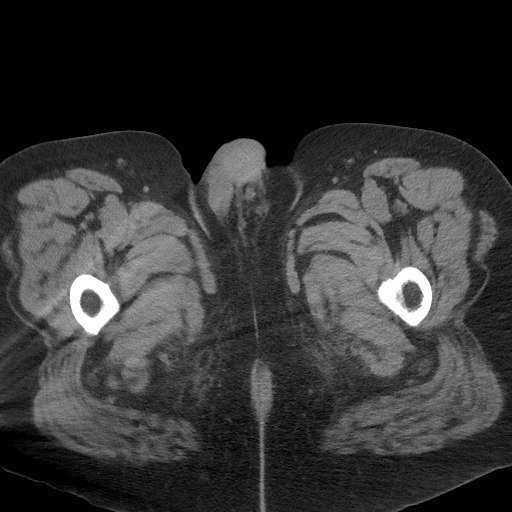
[im 5/101  bone]
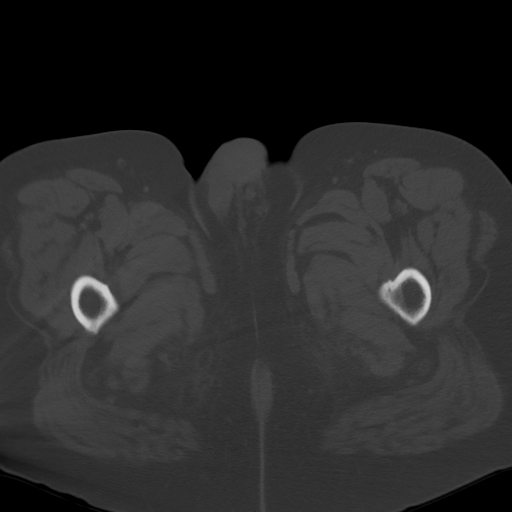
[im 13/101  soft-tissue]
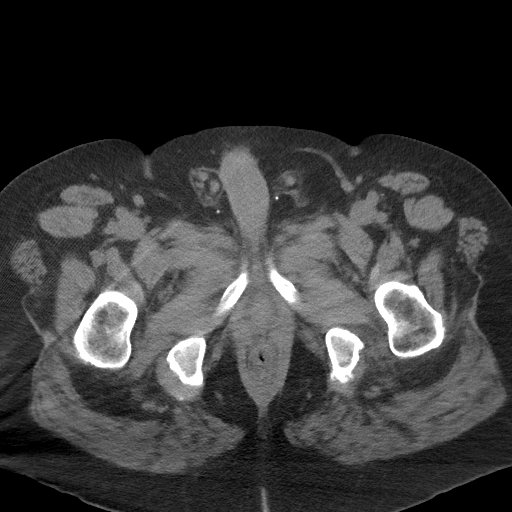
[im 21/101  soft-tissue]
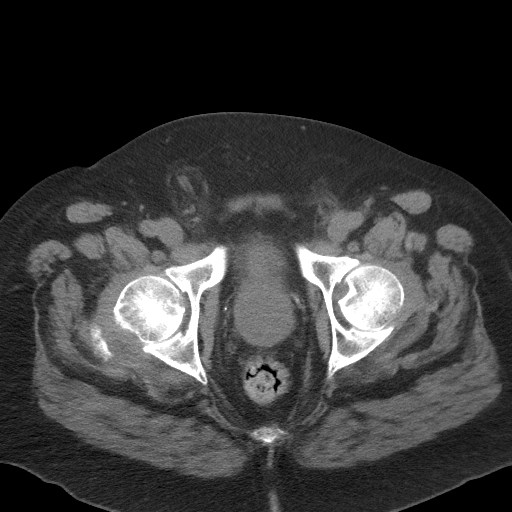
[im 30/101  soft-tissue]
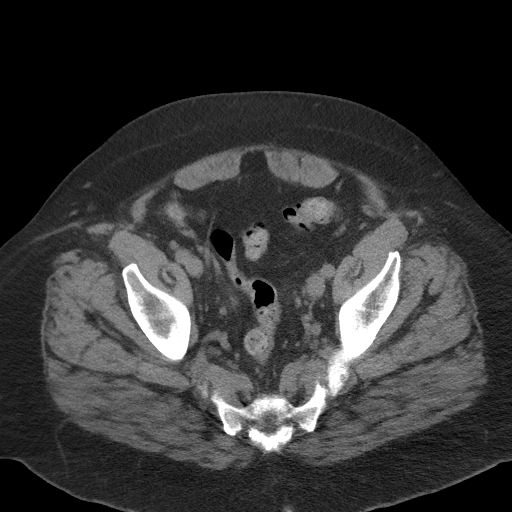
[im 38/101  soft-tissue]
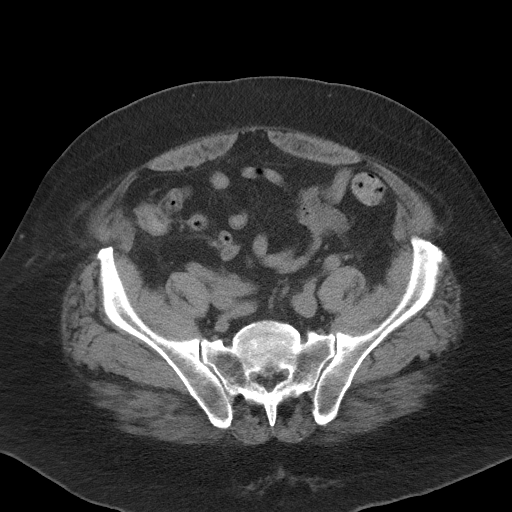
[im 46/101  soft-tissue]
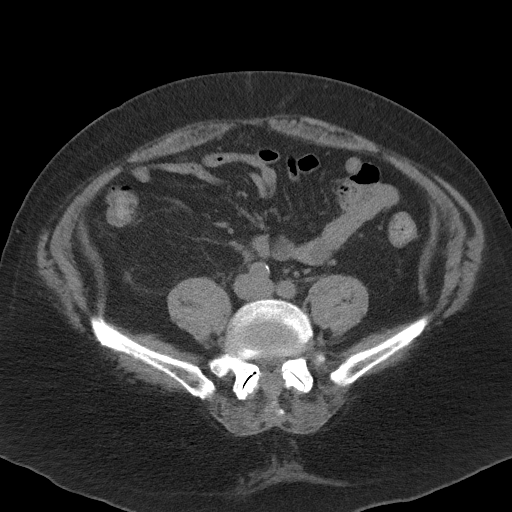
[im 55/101  soft-tissue]
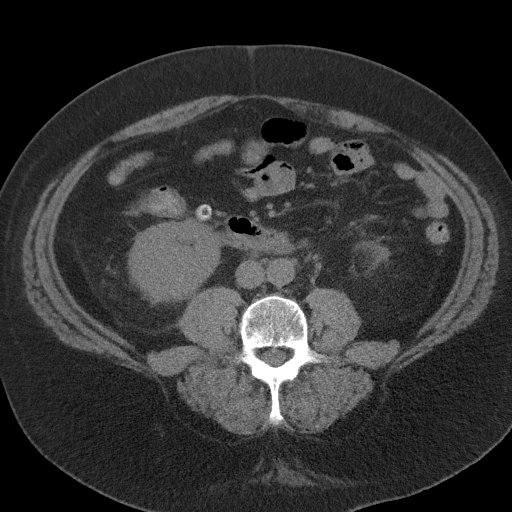
[im 63/101  soft-tissue]
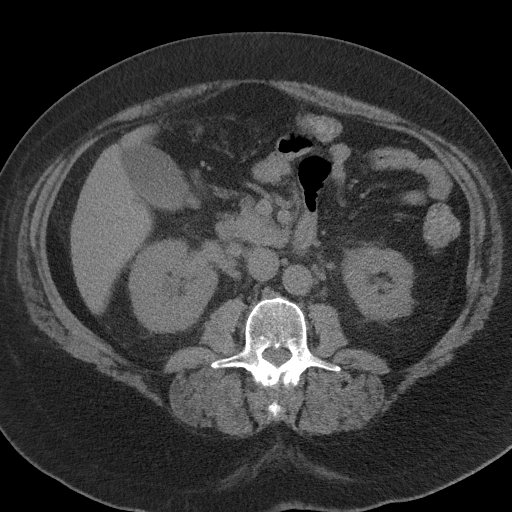
[im 71/101  soft-tissue]
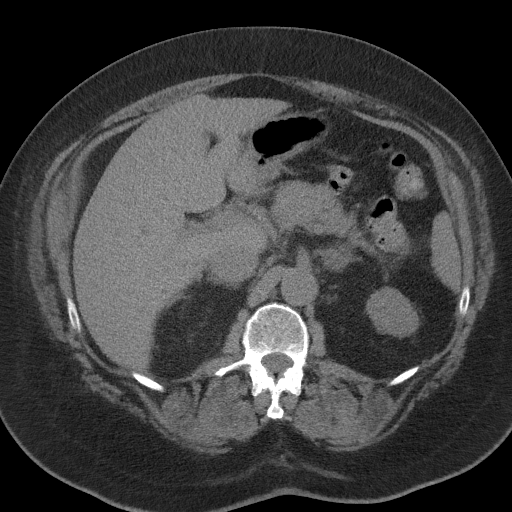
[im 71/101  bone]
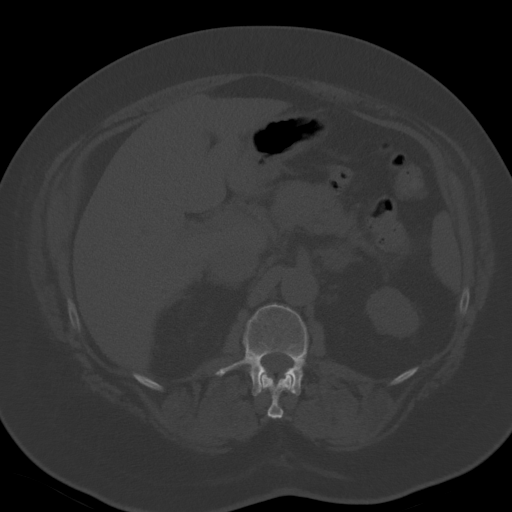
[im 80/101  soft-tissue]
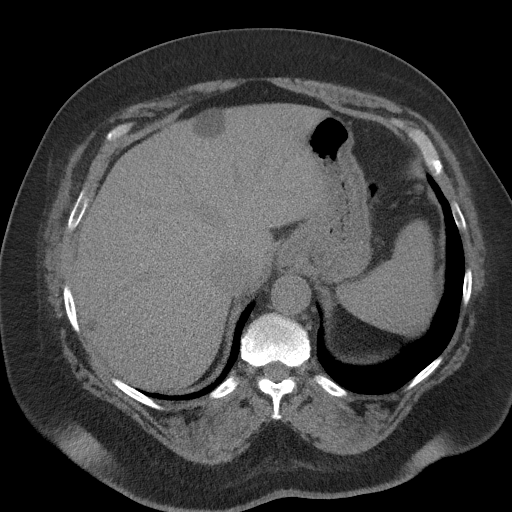
[im 84/101  lung]
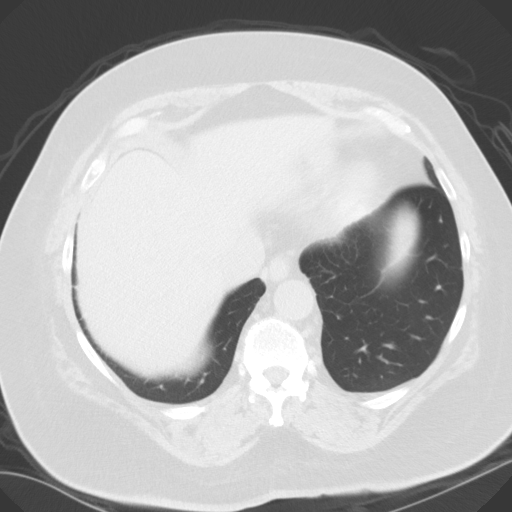
[im 88/101  soft-tissue]
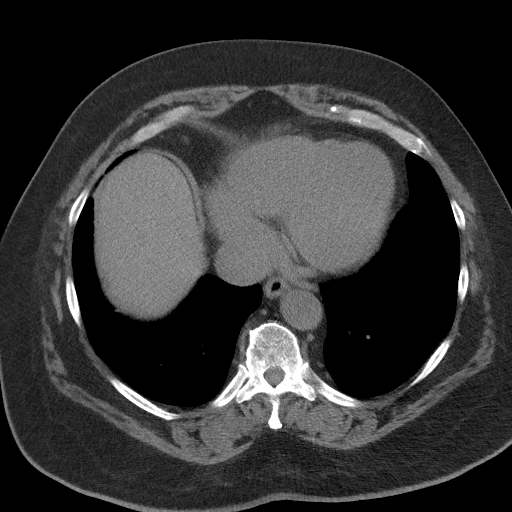
[im 88/101  lung]
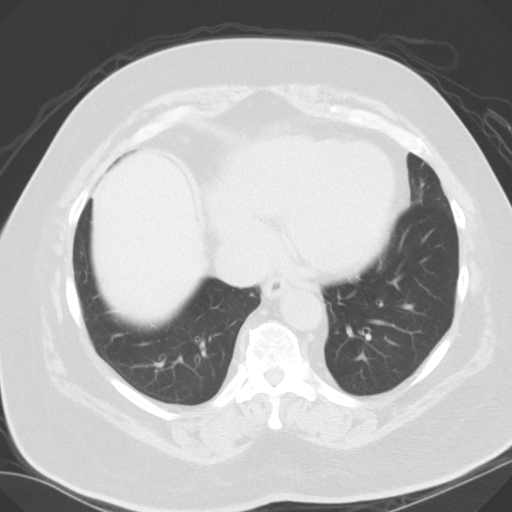
[im 92/101  lung]
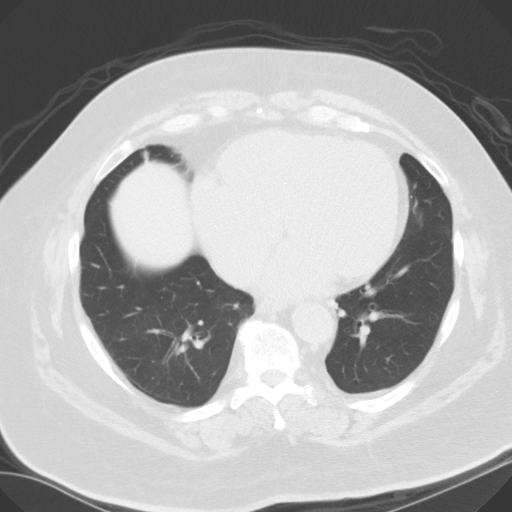
[im 96/101  soft-tissue]
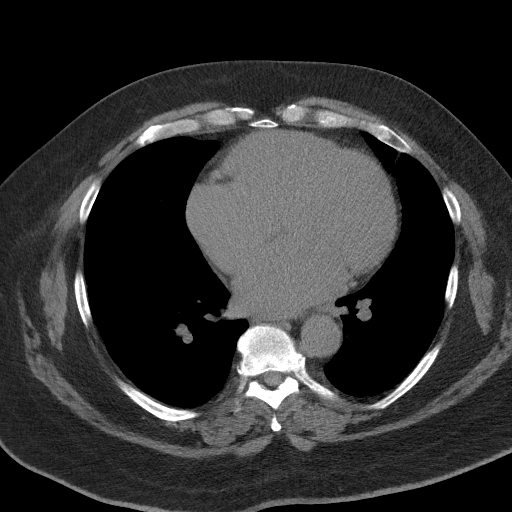
[im 96/101  lung]
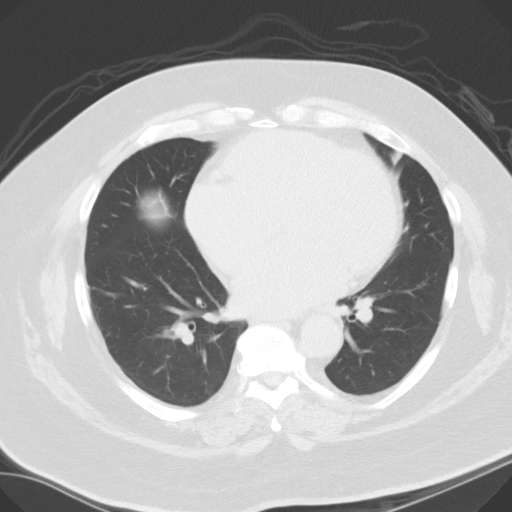

[14 of 32 positions shown; findings below may reference images not displayed]

FINDINGS: Lower chest: 5 by 4 mm left lower lobe pulmonary nodule on image
[DATE].

Mild cardiomegaly.

Hepatobiliary: Multiple small hypodense lesions are present in the
liver and are technically nonspecific. A larger left hepatic lobe
lesion measuring 2.7 by 2.3 cm has an internal density 0 Hounsfield
units, and is most likely a cyst. There is some dependent density in
the gallbladder which is probably from sludge.

Pancreas: Unremarkable

Spleen: Anteromedial splenic convexity due to a 4.8 by 3.6 cm
hypodense splenic lesion which is reasonably homogeneous and which
has a density of 4.7 Hounsfield units. This is measured on image
[DATE].

Adrenals/Urinary Tract: 1.9 by 2.7 cm left adrenal mass, internal
density 1 Hounsfield unit, indicating adrenal adenoma. A more caudad
located 2.6 by 2.1 cm left adrenal mass has an internal density of 8
Hounsfield units, likewise compatible with adenoma.

There is notable scarring in the left kidney upper pole and left mid
kidney. Bilateral symmetric perirenal stranding and indistinctness
of the renal collecting systems noted, particularly on the right.
Nondistended urinary bladder.

Stomach/Bowel: There are a few scattered colonic diverticula.

Vascular/Lymphatic: Aortoiliac atherosclerotic vascular disease. No
pathologic adenopathy.

Reproductive: Unremarkable

Other: No supplemental non-categorized findings.

Musculoskeletal: Congenitally short pedicles in the lower lumbar
spine.
IMPRESSION: 1. Bilateral perirenal stranding with indistinctness of the
collecting systems, especially on the right. Inflammatory process
such as pyelonephritis is not excluded and urine analysis would be
suggested. No discrete urinary tract calculi are identified.
2. Scarring in the left kidney upper pole and left mid kidney. The
largest of the hepatic lesions has fluid density and is probably a
cyst. The smaller lesions are technically nonspecific.
3. Partially exophytic hypodense lesion of the spleen is
nonspecific, although near fluid density.
4. There are 2 left adrenal adenomas.
5.  Aortic Atherosclerosis ([id number].O).
6. 5 by 4 mm left lower lobe pulmonary nodule. No follow-up needed
if patient is low-risk. Non-contrast chest CT can be considered in
12 months if patient is high-risk. This recommendation follows the
consensus statement: Guidelines for Management of Incidental
Pulmonary Nodules Detected on CT Images: From the [HOSPITAL]
